# Patient Record
Sex: Male | Born: 1975 | Race: White | Hispanic: No
Health system: Southern US, Community
[De-identification: ages and names within clinical notes are randomized; demographics above are authoritative.]

---

## 2014-06-05 ENCOUNTER — Emergency Department: Payer: Self-pay | Admitting: Emergency Medicine

## 2014-06-05 LAB — BASIC METABOLIC PANEL
ANION GAP: 9 (ref 7–16)
BUN: 18 mg/dL (ref 7–18)
CALCIUM: 9.7 mg/dL (ref 8.5–10.1)
Chloride: 106 mmol/L (ref 98–107)
Co2: 26 mmol/L (ref 21–32)
Creatinine: 0.91 mg/dL (ref 0.60–1.30)
EGFR (Non-African Amer.): >60
Glucose: 105 mg/dL — ABNORMAL HIGH (ref 65–99)
Osmolality: 284 (ref 275–301)
Potassium: 3.4 mmol/L — ABNORMAL LOW (ref 3.5–5.1)
SODIUM: 141 mmol/L (ref 136–145)

## 2014-06-05 LAB — CBC
HCT: 44.3 % (ref 40.0–52.0)
HGB: 14.9 g/dL (ref 13.0–18.0)
MCH: 31.6 pg (ref 26.0–34.0)
MCHC: 33.7 g/dL (ref 32.0–36.0)
MCV: 94 fL (ref 80–100)
Platelet: 217 10*3/uL (ref 150–440)
RBC: 4.72 10*6/uL (ref 4.40–5.90)
RDW: 13.4 % (ref 11.5–14.5)
WBC: 7 10*3/uL (ref 3.8–10.6)

## 2014-06-10 ENCOUNTER — Emergency Department: Payer: Self-pay | Admitting: Emergency Medicine

## 2015-02-01 NOTE — Consult Note (Signed)
PATIENT NAME:  Bruce Armstrong, Bruce Armstrong MR#:  [id number] DATE OF BIRTH:  05/24/1976  DATE OF CONSULTATION:  06/05/2014  REFERRING PHYSICIAN:   CONSULTING PHYSICIAN:  Kathreen DevoidKevin L. Laurielle Selmon, MD  REASON FOR CONSULTATION: Right index, middle and ring finger lacerations.   HISTORY OF PRESENT ILLNESS: Bruce Armstrong is a 39 year old, right-hand dominant male who sustained an injury to the volar surface of the index, middle, and ring fingers when using a table saw. He had extensive bleeding and came to the Emergency Room for further evaluation and management. Orthopedics was called, given the extent of his injuries.   PHYSICAL EXAMINATION: The patient has lacerations running in line between the index, middle and ring fingers. The wounds involve the DIP of the ring finger, the middle phalanx of the middle finger and the middle phalanx near the PIP joint of the index finger. Tendons are visible in the wound of the index finger and middle finger. The patient has extensive soft tissue loss with approximately quarter-inch gapping in each one of the fingers. The patient's fingers appear perfuse but he has loss of sensation to light touch over the tips of the index, middle and at least the radial side of the ring finger. He is unable to flex the FDS or FDP, but it is unclear whether this is truly due to complete tendon laceration or pain and apprehension.   RADIOLOGY: X-ray films of the right hand were reviewed. This includes AP, lateral and oblique. These films show the extent of the soft tissue laceration. There is a calcification off the middle phalanx of the index finger, which may be a fracture fragment. A definitive fracture through the distal phalanx of the middle phalanx of the middle finger is not clearly identified and there is a possible fracture line through the base of the distal phalanx of the ring finger.   ASSESSMENT: Table saw lacerations of the volar surface of the right ring, middle and index finger.   PLAN:  The patient has extensive soft tissue loss. There is exposed tendons in the wounds. It is possible there is neurovascular injury as well. I explained to the patient that I am not a hand surgeon but rather a sports medicine surgeon. I am going to work on transferring him to an institution that provides hand surgery expertise. I do recommend that he is evaluated and managed by a hand surgeon. The patient has had a tetanus shot within the last 5 years. I have ordered 2 grams of Kefzol given his open wounds. The wounds do not appear to be grossly contaminated. We will  await for word on transfer for hand surgery expertise.    ____________________________ Kathreen DevoidKevin L. Jenilee Franey, MD klk:TT D: 06/05/2014 16:55:10 ET T: 06/05/2014 17:26:48 ET JOB#: [id number]  cc: Kathreen DevoidKevin L. Cyann Venti, MD, <Dictator> Kathreen DevoidKEVIN L Lorraine Cimmino MD ELECTRONICALLY SIGNED 06/09/2014 11:01

## 2015-12-08 IMAGING — CR RIGHT HAND - COMPLETE 3+ VIEW
1 series · 3 of 3 positions shown · non-contrast
Comparison: None.

CLINICAL DATA: Right hand soft delayed injury

EXAM:
RIGHT HAND - COMPLETE 3+ VIEW

[Series 1: pa · 0.17mm/px · 3 of 3 slices shown]
[im 1/3]
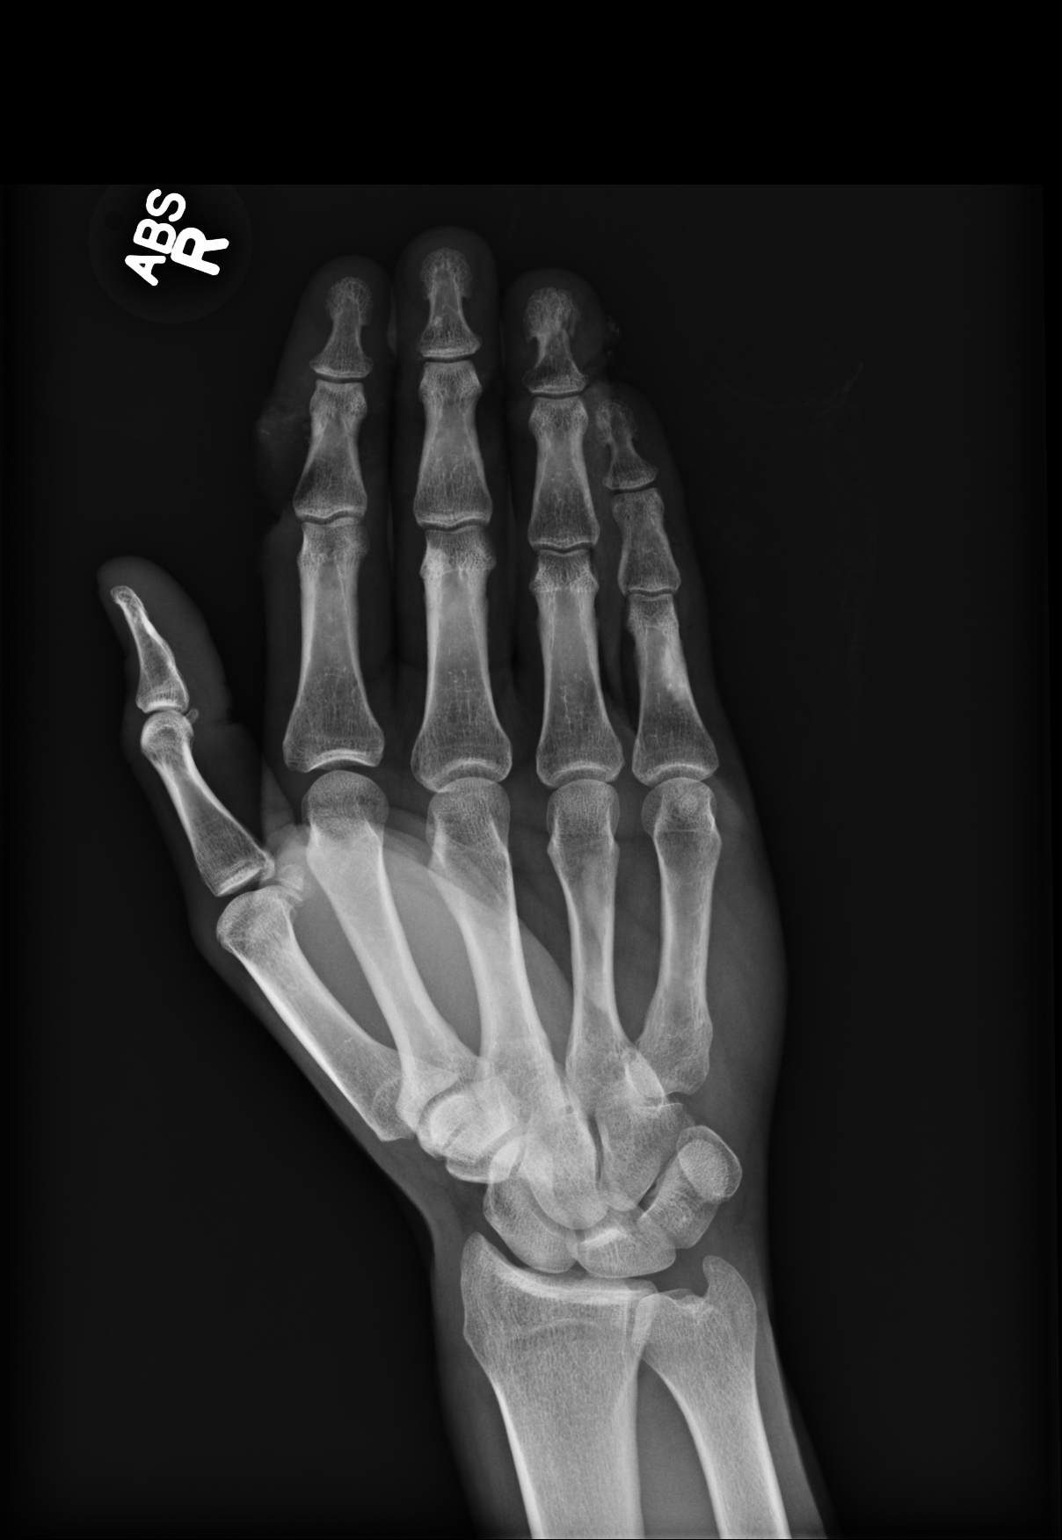
[im 2/3]
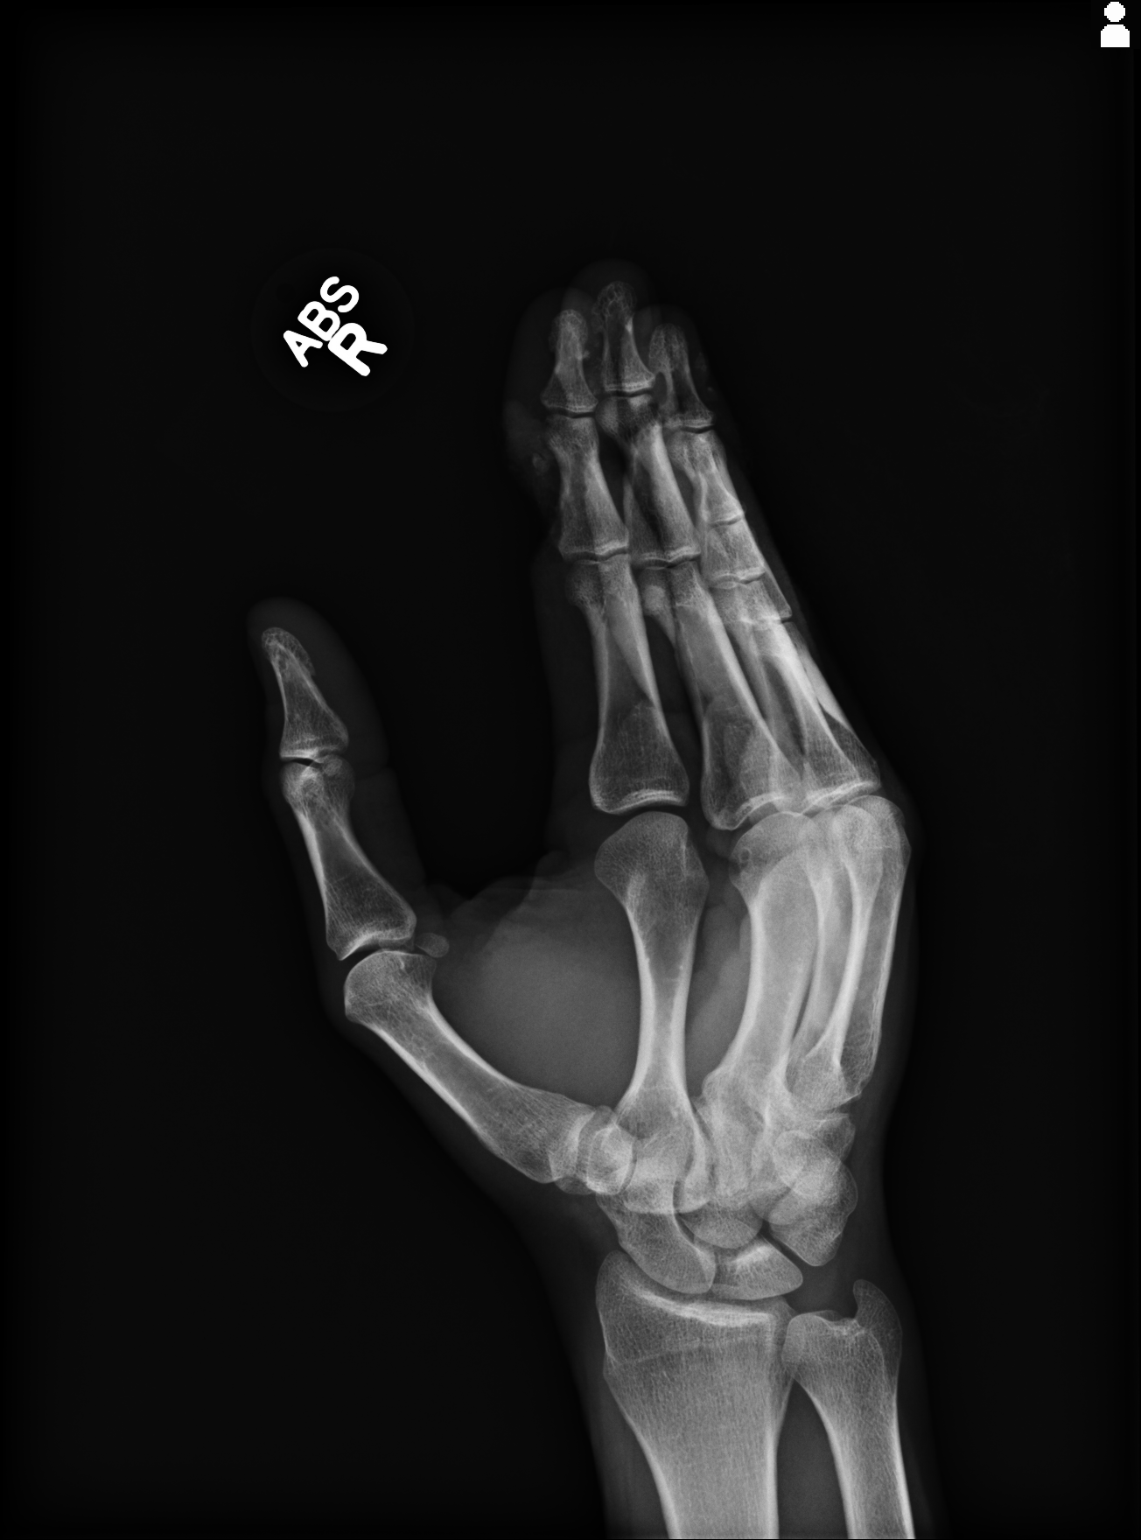
[im 3/3]
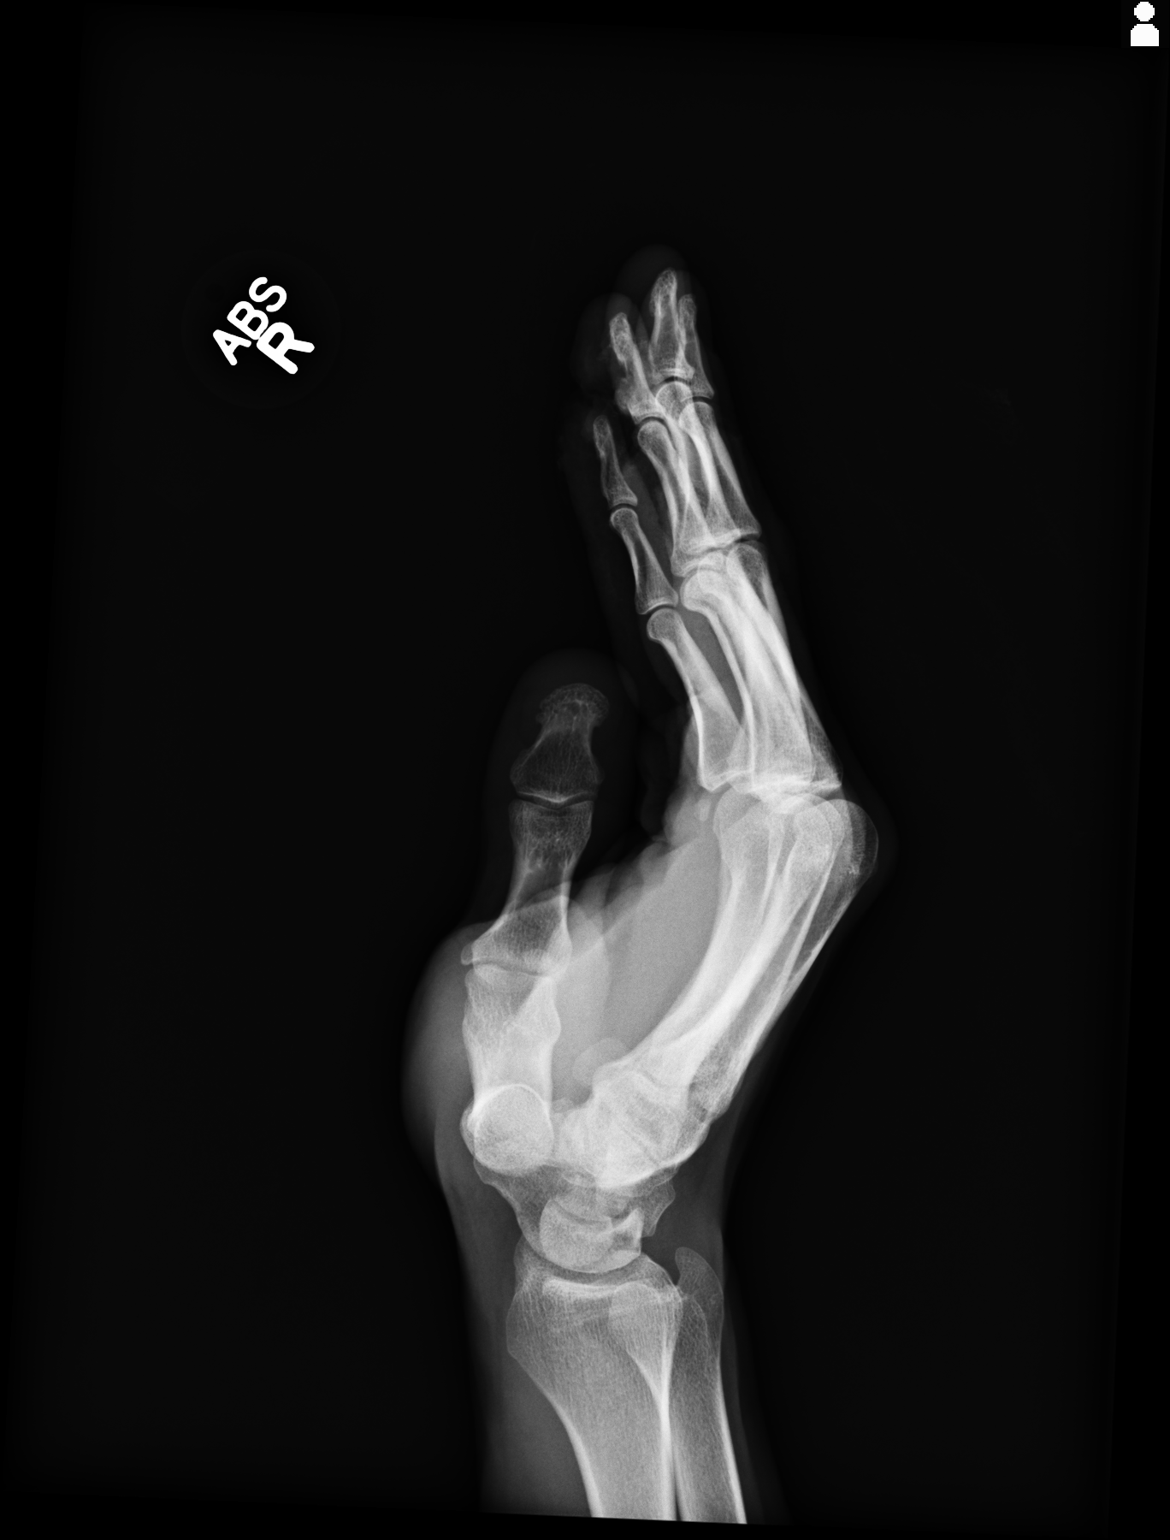

[3 of 3 positions shown; findings below may reference images not displayed]

FINDINGS: The bones of the right hand are adequately mineralized. There is
disruption of the soft tissues over the radial aspect of the PIP
joint of the index finger as well as of the soft tissues of the
overlying the middle phalanx. No underlying fracture is
demonstrated. Subtle radiodense material in the soft tissues of the
middle phalanx of the index finger present may reflect foreign
bodies. There is radiodense material projecting over the lateral
aspect of the distal phalanx of the fourth finger. There may be
fragmentation of the lateral aspect of the tuft of the fourth finger
is well.
IMPRESSION: 1. There is soft tissue injury but no acute fracture of the index
finger. Retained radiodense foreign material in the wound is
suspected.
2. A tiny fracture through the tuft of the distal phalanx of the
fourth finger is present. A foreign material within the overlying
soft tissues is suspected.
3.
